# Patient Record
Sex: Female | Born: 1970 | Race: Black or African American | Hispanic: No | Smoking: Never smoker
Health system: Southern US, Community
[De-identification: ages and names within clinical notes are randomized; demographics above are authoritative.]

## PROBLEM LIST (undated history)

## (undated) DIAGNOSIS — H269 Unspecified cataract: Secondary | ICD-10-CM

## (undated) DIAGNOSIS — J45909 Unspecified asthma, uncomplicated: Secondary | ICD-10-CM

## (undated) DIAGNOSIS — D573 Sickle-cell trait: Secondary | ICD-10-CM

## (undated) DIAGNOSIS — L309 Dermatitis, unspecified: Secondary | ICD-10-CM

## (undated) HISTORY — DX: Unspecified cataract: H26.9

## (undated) HISTORY — DX: Dermatitis, unspecified: L30.9

## (undated) HISTORY — DX: Unspecified asthma, uncomplicated: J45.909

## (undated) HISTORY — PX: CATARACT EXTRACTION: SUR2

## (undated) HISTORY — DX: Sickle-cell trait: D57.3

---

## 2003-02-20 ENCOUNTER — Encounter (INDEPENDENT_AMBULATORY_CARE_PROVIDER_SITE_OTHER): Payer: Self-pay | Admitting: Specialist

## 2003-02-20 ENCOUNTER — Encounter: Payer: Self-pay | Admitting: Obstetrics and Gynecology

## 2003-02-20 ENCOUNTER — Ambulatory Visit (HOSPITAL_COMMUNITY): Admission: AD | Admit: 2003-02-20 | Discharge: 2003-02-20 | Payer: Self-pay | Admitting: Obstetrics and Gynecology

## 2004-01-16 ENCOUNTER — Ambulatory Visit (HOSPITAL_COMMUNITY): Admission: RE | Admit: 2004-01-16 | Discharge: 2004-01-16 | Payer: Self-pay | Admitting: Obstetrics and Gynecology

## 2004-02-12 ENCOUNTER — Inpatient Hospital Stay (HOSPITAL_COMMUNITY): Admission: AD | Admit: 2004-02-12 | Discharge: 2004-02-13 | Payer: Self-pay | Admitting: *Deleted

## 2004-04-02 ENCOUNTER — Ambulatory Visit (HOSPITAL_COMMUNITY): Admission: RE | Admit: 2004-04-02 | Discharge: 2004-04-02 | Payer: Self-pay | Admitting: Obstetrics and Gynecology

## 2009-04-01 ENCOUNTER — Encounter (INDEPENDENT_AMBULATORY_CARE_PROVIDER_SITE_OTHER): Payer: Self-pay | Admitting: Oral Surgery

## 2009-04-01 ENCOUNTER — Ambulatory Visit (HOSPITAL_COMMUNITY): Admission: RE | Admit: 2009-04-01 | Discharge: 2009-04-01 | Payer: Self-pay | Admitting: Oral Surgery

## 2010-10-30 LAB — CBC
HCT: 35.6 % — ABNORMAL LOW (ref 36.0–46.0)
Hemoglobin: 11.7 g/dL — ABNORMAL LOW (ref 12.0–15.0)
MCHC: 32.9 g/dL (ref 30.0–36.0)
RBC: 4.33 MIL/uL (ref 3.87–5.11)
RDW: 16.5 % — ABNORMAL HIGH (ref 11.5–15.5)

## 2010-10-30 LAB — BASIC METABOLIC PANEL
CO2: 24 mEq/L (ref 19–32)
Calcium: 8.9 mg/dL (ref 8.4–10.5)
Creatinine, Ser: 0.64 mg/dL (ref 0.4–1.2)
Glucose, Bld: 90 mg/dL (ref 70–99)
Sodium: 138 mEq/L (ref 135–145)

## 2010-12-11 NOTE — H&P (Signed)
NAMEBRYLA, BUREK                          ACCOUNT NO.:  [id number]   MEDICAL RECORD NO.:  [id number]                   PATIENT TYPE:  OUT   LOCATION:  ULT                                  [facility]:  [facility]   PHYSICIAN:  Crist Fat. Rivard, M.D.              DATE OF BIRTH:  04-01-1971   DATE OF ADMISSION:  01/16/2004  DATE OF DISCHARGE:  01/16/2004                                HISTORY & PHYSICAL   HISTORY OF PRESENT ILLNESS:  [name] is a 40 year old, gravida 5, para 3-  0-1-3, who presents at 61 and 1/7ths weeks for induction of labor secondary  to ultrasound showing the baby at the 75th to 90th percentile, and cervix 2-  3 cm, with irregular contractions.  She does report positive fetal movement.  No bleeding.  No rupture of membranes.  Her pregnancy has been followed by  the C.N.M. service at [facility], and is remarkable for (1) severe eczema, (2)  second trimester loss, (3) asthma, and (4) sickle cell trait.  The patient  was initially evaluated at the office of [facility] on July 29, 2003 at  approximately [redacted] weeks gestation.  EDC determined by dates, confirmed with  18-week ultrasound.  Her pregnancy has been essentially unremarkable, except  for long-standing history of eczema.  The patient did experience some  allergies, which affected her asthma.  She took Zyrtec with relief.  She has  had several ultrasounds this pregnancy, at 72, 27, 31, and 38 weeks.  The  most recent ultrasound on January 16, 2004 found an intrauterine single  pregnancy at 38 weeks at the 75th to 90th percentile.  Fluid was normal at  that time, with an AFI of 9.8 cm.  Placenta fundal posterior, grade II, no  previa.  At that time, the baby was in a cephalic presentation.  The patient  has reported good fetal movement since that time with irregular  contractions, no bleeding, no rupture of membranes.  She denies any PIH  symptoms.  No headache, visual changes, or epigastric pain.   PRENATAL LABORATORY WORK:  The  prenatal lab work on August 02, 2003 revealed  hemoglobin and hematocrit of 12.6 and 35.7, platelets 237,000.  Blood type  and Rh A positive, antibody screen negative, VDRL nonreactive, rubella  immune.  Hepatitis B surface antigen negative.  HIV declined.  GC and  Chlamydia negative.  Quad screen declined.  A 36 weeks, culture of the  vaginal tract is negative for group B strep, GC, and Chlamydia.   OB HISTORY:  In 1993, the patient had a normal spontaneous vaginal delivery  at 40 plus weeks in [place], [place], with the birth of a 7 plus  pound female infant with no complications.  Her name is Tiffany Ramos.  In 1996,  the patient had a normal spontaneous vaginal delivery with the birth of an 8  plus pound female infant named Tiffany Ramos.  In  1999, the patient had a NSVD in  [place] again with the birth of a 6 pound, 7 ounce female infant names  Tiffany Ramos.   MEDICAL HISTORY:  The patient has a history of an abnormal Pap smear, and  possibly LEEP procedure.  The patient has severe eczema over entire body  surface.  She utilizes several creams, which do help some.  On August 13, 2003, she was evaluated at [facility] and found to have atopic dermatitis.  She was to use erythromycin.  She did seek care in [place], as this was  more convenient, in February, and was treated with Keflex and triamcinolone  cream 1% . The itching has gotten somewhat better; however, the eczema  remains covering her body.  The patient with a history of asthma, and sickle  cell trait.   SURGICAL HISTORY:  D and E following second trimester loss.   FAMILY HISTORY:  Maternal aunt and maternal uncle with a history of heart  disease.  Maternal uncles x2 and patient's mother with history of  hypertension.  The patient's mother with a history of diabetes; also  maternal grandmother and paternal grandmother.  The patient's maternal aunt  has a history of seizures.   Otherwise, the patient has been hospitalized for  childbirth.   GENETIC HISTORY:  The patient has a history of sickle cell trait, and the  patient's mother also with sickle cell trait.   ALLERGIES:  No known drug allergies.   SOCIAL HISTORY:  She denies the use of tobacco, alcohol, or illicit drugs.   REVIEW OF SYSTEMS:  There are no signs or symptoms suggestive of focal or  systemic disease, and the patient is typical of one with a uterine pregnancy  at term at 41 weeks, with irregular contractions, for induction of labor.   PHYSICAL EXAMINATION:  VITAL SIGNS:  Stable, afebrile.  HEENT:  Unremarkable.  HEART:  Regular rate and rhythm.  LUNGS:  Clear.  ABDOMEN:  Gravid in its contour.  Uterine fundus is noted to extend 40 cm  above the level of the pubic symphysis.  Leopold's maneuver finds the infant  to be in a longitudinal lie, cephalic presentation.  Estimated fetal weight  is 8.5 pounds.  NST with a baseline of 140, with average long-term  variability reactivity is present with no periodic changes.  The patient is  contracting mildly and irregularly.  PELVIC:  Her cervix when checked in MAU on Sunday was a tight 2 cm dilated,  60% to 70% effaced, vertex -2.  EXTREMITIES:  No pathologic edema.  DTR's are 1+ with no clonus.   ASSESSMENT:  Intrauterine pregnancy at 41 weeks for induction of labor.   PLAN:  Admit per Dr. Dois Davenport Rivard.  Routine CSF orders.   PLAN:  Artificial rupture of membranes to stimulate labor, and Pitocin if  labor does not follow.  The patient is in agreement with this plan.     Rica Koyanagi, C.N.M.               Crist Fat Rivard, M.D.    SDM/MEDQ  D:  02/11/2004  T:  02/11/2004  Job:  [id number]

## 2010-12-11 NOTE — Op Note (Signed)
   NAMEMARSHAL, SCHRECENGOST                          ACCOUNT NO.:  [id number]   MEDICAL RECORD NO.:  [id number]                   PATIENT TYPE:  AMB   LOCATION:  [facility]                                 [facility]ILITY:  [facility]   PHYSICIAN:  Phil D. Okey Dupre, M.D.                  DATE OF BIRTH:  09-23-1970   DATE OF PROCEDURE:  02/20/2003  DATE OF DISCHARGE:                                 OPERATIVE REPORT   PROCEDURE:  Dilatation and evacuation.   PREOPERATIVE DIAGNOSIS:  Missed abortion.   POSTOPERATIVE DIAGNOSIS:  Missed abortion.   SURGEON:  Javier Glazier. Rose, M.D.   ESTIMATED BLOOD LOSS:  50 mL.   ANESTHESIA:  MAC plus local.   OPERATIVE FINDINGS:  Uterus about ten weeks gestational size; however, only  sounded to a depth of 7 cm; therefore, probably leiomyomata.  This was very  well confirmed by the examination after the procedure, which revealed the  uterus as still feeling like it was eight weeks gestational size.  Old type  of products of conception appeared to be present, because of the dark  burgundy to brown color of the evacuated products.   DESCRIPTION OF PROCEDURE:  Under satisfactory MAC sedation, with the patient  in the dorsal lithotomy position, the perineum was prepped and draped in the  usual sterile manner.  Bimanual pelvic examination revealed a uterus which  was about ten weeks gestational size, regular configuration with normal  adnexa.   A weighted speculum was placed in the posterior fourchette of the vagina and  a single toothed tenaculum placed.  BUS was within normal length.  The  vagina was cleaned and well irrigated.  The paracervix was grasped with a  single-toothed tenaculum, and 10 mL of 1% Xylocaine was injected in the  paracervical areas at 4 and 8 o'clock (10 mL in each area).  The uterine  cavity was then sounded to a depth of 7 cm anteriorly, and the cervical os  dilated with ease to a #10 Hagar dilator.   A #10 curved suction curet was used to evacuate the  uterine contents without  incident as described above.   ESTIMATED BLOOD LOSS:  Blood loss during the procedure approximately 500 mL.   The tenaculum and speculum were removed from the vagina.  The cervix was  observed for bleeding; none was noted.  Products of conception were sent for  pathological diagnoses.   The patient was transferred to the recovery room in satisfactory condition.                                              Phil D. Okey Dupre, M.D.   PDR/MEDQ  D:  02/20/2003  T:  02/20/2003  Job:  [id number]

## 2010-12-11 NOTE — Op Note (Signed)
NAMEARLINDA, Tiffany Ramos                          ACCOUNT NO.:  [id number]   MEDICAL RECORD NO.:  [id number]                   PATIENT TYPE:  AMB   LOCATION:  [facility]                                  [facility]   PHYSICIAN:  Osborn Coho, M.D.                DATE OF BIRTH:  Mar 28, 1971   DATE OF PROCEDURE:  04/02/2004  DATE OF DISCHARGE:                                 OPERATIVE REPORT   PREOPERATIVE DIAGNOSES:  1.  Desires permanent sterilization.  2.  Status post normal spontaneous vaginal delivery, February 12, 2004.   POSTOPERATIVE DIAGNOSIS:  1.  Desires permanent sterilization.  2.  Status post normal spontaneous vaginal delivery, February 12, 2004.   OPERATION PERFORMED:  Laparoscopic tubal sterilization.   SURGEON:  Osborn Coho, M.D.   ANESTHESIA:  General.   FLUIDS REPLACED:  1200 mL.   URINE OUTPUT:  Staight cath prior to procedure approximately 200 mL.   ESTIMATED BLOOD LOSS:  Minimal.   COMPLICATIONS:  None.   FINDINGS:  Normal-appearing bilateral ovaries and fallopian tubes.   DESCRIPTION OF PROCEDURE:  The patient was taken to the operating room after  the risks, benefits and alternatives were discussed with the patient.  The  patient verbalized understanding and consent signed and witnessed.  The  patient was placed under general anesthesia, prepped and draped in the  normal sterile fashion.  The patient was prepped in dorsal lithotomy  position and a bivalve speculum placed in the patient's vagina and a Hulka  tenaculum placed for uterine manipulation.  The speculum was removed.  Attention was turned to the umbilicus where a 10 mm incision was made and a  Veress needle passed into the intra-abdominal cavity.  Pneumoperitoeum  achieved.  A 10 mm trocar was then advanced to the intra-abdominal cavity  and the laparoscope introduced with the findings as noted above.  The right  fallopian tube was clamped in the isthmic portion for four contiguous  segments and  coagulated with the bipolar Kleppingers.  The same was done on  the left fallopian tube.  An approximately 2 cm  stump remained from the uterine cornua.  Pneumoperitoneum was relieved and  instruments removed under direct visualization.  The fascia was repaired  with 0 Vicryl in a running fashion.  The skin was closed with 3-0 Monocryl  using a subcuticular stitch.  The patient tolerated the procedure well and  is currently being extubated per anesthesia.                                               Osborn Coho, M.D.    AR/MEDQ  D:  04/02/2004  T:  04/02/2004  Job:  [id number]

## 2014-07-11 DIAGNOSIS — L209 Atopic dermatitis, unspecified: Secondary | ICD-10-CM | POA: Insufficient documentation

## 2015-07-08 ENCOUNTER — Encounter: Payer: Self-pay | Admitting: Allergy and Immunology

## 2015-07-08 ENCOUNTER — Ambulatory Visit (INDEPENDENT_AMBULATORY_CARE_PROVIDER_SITE_OTHER): Payer: Medicaid Other | Admitting: Allergy and Immunology

## 2015-07-08 VITALS — BP 112/72 | HR 80 | Temp 98.2°F | Resp 20 | Ht 66.14 in | Wt 181.9 lb

## 2015-07-08 DIAGNOSIS — J309 Allergic rhinitis, unspecified: Secondary | ICD-10-CM

## 2015-07-08 DIAGNOSIS — L209 Atopic dermatitis, unspecified: Secondary | ICD-10-CM | POA: Diagnosis not present

## 2015-07-08 DIAGNOSIS — H1010 Acute atopic conjunctivitis, unspecified eye: Secondary | ICD-10-CM | POA: Diagnosis not present

## 2015-07-08 DIAGNOSIS — J4530 Mild persistent asthma, uncomplicated: Secondary | ICD-10-CM

## 2015-07-08 MED ORDER — CETIRIZINE HCL 10 MG PO TABS: 10.0000 mg | ORAL_TABLET | Freq: Every day | ORAL | Status: DC

## 2015-07-08 MED ORDER — ALBUTEROL SULFATE HFA 108 (90 BASE) MCG/ACT IN AERS: 2.0000 | INHALATION_SPRAY | RESPIRATORY_TRACT | Status: DC | PRN

## 2015-07-08 MED ORDER — BECLOMETHASONE DIPROPIONATE 40 MCG/ACT IN AERS: 2.0000 | INHALATION_SPRAY | Freq: Every day | RESPIRATORY_TRACT | Status: DC

## 2015-07-08 MED ORDER — FLUTICASONE PROPIONATE 50 MCG/ACT NA SUSP: 2.0000 | Freq: Every day | NASAL | Status: DC

## 2015-07-08 NOTE — Progress Notes (Addendum)
[facility]    NEW PATIENT NOTE  Referring Provider: Jackie Plum, MD Primary Provider: Jackie Plum, MD    Subjective:   Chief Complaint:  Tiffany Ramos is a 44 y.o. female with a chief complaint of Eczema and Asthma  who presents to the clinic with the following problems:  HPI Comments:  Tiffany Ramos returns to this clinic on 07/08/2015 in reevaluation of her asthma, allergic rhinoconjunctivitis, and atopic dermatitis. She's not been seen in this clinic for 3-1/2 years. Initially she was seen by Dr. Willa Rough who documented significant aero allergen hypersensitivity and provided her with allergen avoidance measures. According to [facility] she feels like her asthma is under good control as long she continues to use Qvar 40 2 inhalations one time per day. While doing so she has not had any exacerbations of her asthma requiring her to get a systemic steroids nor does she use a short acting bronchodilator very often. Revoking factors for her asthma do include going outdoors during the spring and getting exposed to lots of pollen. She also feels as though consistent use of Flonase has resulted in very good control of her upper airway symptoms. Once again provoking factors include exposure to the outdoors during the spring. She has very severe atopic dermatitis is and is now under the care of [facility] department and apparently she was being treated with methotrexate as well as topical anti-inflammatory agents. She is very satisfied with the response she is receiving from the therapy that she is receiving at [facility]. Tiffany Ramos also informs me that she does not eat pecan based upon skin test reactivity to this not. However, it should be noted that she has never had a reaction to pecan in the past and she can eat walnut without any difficulty and when I reviewed the skin test that she had  performed in the past the skin test only identified hypersensitivity against pecan pollen and not pecan food product.   Past Medical History  Diagnosis Date  . Asthma   . Eczema   . Sickle cell trait (HCC)     History reviewed. No pertinent past surgical history.  Outpatient Encounter Prescriptions as of 07/08/2015  Medication Sig  . albuterol (PROAIR HFA) 108 (90 BASE) MCG/ACT inhaler Inhale 2 puffs into the lungs every 4 (four) hours as needed.  . beclomethasone (QVAR) 40 MCG/ACT inhaler Inhale 2 puffs into the lungs daily.  Marland Kitchen desonide (DESOWEN) 0.05 % ointment daily.  . folic acid (FOLVITE) 1 MG tablet Take 1 mg by mouth daily.  Marland Kitchen triamcinolone ointment (KENALOG) 0.1 % 2 (two) times daily.  . [DISCONTINUED] albuterol (PROAIR HFA) 108 (90 BASE) MCG/ACT inhaler Inhale 2 puffs into the lungs every 4 (four) hours as needed.  . [DISCONTINUED] beclomethasone (QVAR) 40 MCG/ACT inhaler Inhale 2 puffs into the lungs daily.  . cetirizine (ZYRTEC) 10 MG tablet Take 1 tablet (10 mg total) by mouth daily.  . Cholecalciferol (D 1000) 1000 UNITS capsule Take 1,000 Units by mouth daily.  . fluticasone (FLONASE) 50 MCG/ACT nasal spray Place 2 sprays into both nostrils daily.  . Olopatadine HCl (PATADAY) 0.2 % SOLN Apply to eye 2 (two) times daily.   No facility-administered encounter medications on file as of 07/08/2015.    Meds ordered this encounter  Medications  . albuterol (PROAIR HFA) 108 (90 BASE) MCG/ACT inhaler    Sig: Inhale 2 puffs into the lungs every  4 (four) hours as needed.    Dispense:  18 g    Refill:  2  . beclomethasone (QVAR) 40 MCG/ACT inhaler    Sig: Inhale 2 puffs into the lungs daily.    Dispense:  1 Inhaler    Refill:  11  . fluticasone (FLONASE) 50 MCG/ACT nasal spray    Sig: Place 2 sprays into both nostrils daily.    Dispense:  16 g    Refill:  11  . cetirizine (ZYRTEC) 10 MG tablet    Sig: Take 1 tablet (10 mg total) by mouth daily.    Dispense:  30 tablet     Refill:  11    No Known Allergies  Review of Systems  Constitutional: Negative for fever, chills and fatigue.  HENT: Negative for congestion, ear pain, facial swelling, hearing loss, nosebleeds, postnasal drip, rhinorrhea, sinus pressure, sneezing, sore throat, tinnitus, trouble swallowing and voice change.   Eyes: Negative for pain, discharge, redness and itching.  Respiratory: Negative for cough, chest tightness, shortness of breath and wheezing.   Cardiovascular: Negative for chest pain and leg swelling.  Gastrointestinal: Negative for nausea, vomiting and abdominal pain.  Endocrine: Negative for cold intolerance and heat intolerance.  Musculoskeletal: Negative for myalgias and arthralgias.  Skin: Positive for rash.  Allergic/Immunologic: Negative.   Neurological: Negative for dizziness and headaches.  Hematological: Negative for adenopathy.    Family History  Problem Relation Age of Onset  . Hypertension Mother   . Diabetes Mother   . Eczema Father   . Eczema Sister   . Eczema Paternal Aunt     Social History   Social History  . Marital Status: Legally Separated    Spouse Name: N/A  . Number of Children: N/A  . Years of Education: N/A   Occupational History  . Not on file.   Social History Main Topics  . Smoking status: Never Smoker   . Smokeless tobacco: Never Used  . Alcohol Use: Not on file  . Drug Use: Not on file  . Sexual Activity: Not on file   Other Topics Concern  . Not on file   Social History Narrative  . No narrative on file    Environmental and Social history  Lives in a house with a dry environment, a dog located outside the household, carpeting in the bedroom, sleeping on a step mattress with plastic on the bed and pillow, and no smokers located inside the household. She is a stay-at-home mom.   Objective:   Filed Vitals:   07/08/15 1416  BP: 112/72  Pulse: 80  Temp: 98.2 F (36.8 C)  Resp: 20   Height: 5' 6.14" (168  cm) Weight: 181 lb 14.1 oz (82.5 kg)  Physical Exam  Constitutional: She appears well-developed and well-nourished. No distress.  HENT:  Head: Normocephalic and atraumatic. Head is without right periorbital erythema and without left periorbital erythema.  Right Ear: Tympanic membrane, external ear and ear canal normal. No drainage or tenderness. No foreign bodies. Tympanic membrane is not injected, not scarred, not perforated, not erythematous, not retracted and not bulging. No middle ear effusion.  Left Ear: Tympanic membrane, external ear and ear canal normal. No drainage or tenderness. No foreign bodies. Tympanic membrane is not injected, not scarred, not perforated, not erythematous, not retracted and not bulging.  No middle ear effusion.  Nose: Mucosal edema present. No rhinorrhea, nose lacerations or sinus tenderness.  No foreign bodies.  Mouth/Throat: Oropharynx is clear and moist. Abnormal dentition (  Edentulous). No oropharyngeal exudate, posterior oropharyngeal edema, posterior oropharyngeal erythema or tonsillar abscesses.  Eyes: Lids are normal. Right eye exhibits no chemosis, no discharge and no exudate. No foreign body present in the right eye. Left eye exhibits no chemosis, no discharge and no exudate. No foreign body present in the left eye. Right conjunctiva is not injected. Left conjunctiva is not injected.  Neck: Neck supple. No tracheal tenderness present. No tracheal deviation and no edema present. No thyroid mass and no thyromegaly present.  Cardiovascular: Normal rate, regular rhythm, S1 normal and S2 normal.  Exam reveals no gallop.   No murmur heard. Pulmonary/Chest: No accessory muscle usage or stridor. No respiratory distress. She has no wheezes. She has no rhonchi. She has no rales.  Abdominal: Soft. She exhibits no distension and no mass. There is no tenderness. There is no rebound and no guarding.  Musculoskeletal: She exhibits no edema or tenderness.  Lymphadenopathy:        Head (right side): No tonsillar adenopathy present.       Head (left side): No tonsillar adenopathy present.    She has no cervical adenopathy.  Neurological: She is alert.  Skin: Rash noted. She is not diaphoretic.  Widespread hyperpigmented lichenified plaques across greater than 90% of her body with slight erythema and significant    Psychiatric: She has a normal mood and affect. Her behavior is normal.    Diagnostics:  Allergy skin tests were not performed.   Spirometry was performed and demonstrated an FEV1 of 1.92 @ 77 % of predicted.  The patient had an Asthma Control Test with the following results: ACT Total Score: 24.     Assessment and Plan:    1. Mild persistent asthma, uncomplicated   2. Allergic rhinoconjunctivitis   3. Atopic dermatitis      1. Qvar 40 2 puffs one time per day. Increase to 3 inhalations 3 times per day during "asthma flareup"  2. Continue Flonase one-2 sprays each nostril one time per day  3. Continue ProAir HFA 2 puffs every 4-6 hours if needed  4. Use cetirizine 10 mg one tablet one time per day if needed  5. Annual fall flu vaccine every year  6. Continue therapy prescribed by dermatologist  7. Return to clinic in 1 year or earlier if problem  Shalandra's atopic respiratory disease appears to be under relatively good control this point in time will consistently using Qvar and Flonase and performing some allergen avoidance measures. She is not very interested in pursuing any additional evaluation or treatment for her atopic disease at this point in time and is very satisfied with the response she is receiving while utilizing therapy described above. I've renewed all of her medications and we will see her back in this clinic should there be a significant problem during the interval of one year. She would definitely be a candidate for immunotherapy in an attempt to alter her immunologic dysregulation. I am not going to address the issue of  her atopic dermatitis as this appears to be under the control of the Medical Center at this point. I do not think there is any need to have her avoid pecan as her skin test reactivity was directed against pollen and not a specific food and she does not have a history of having reactivity directed against pecan and she can eat walnuts, a close cross reactor with pecan, without any difficulty.    Laurette SchimkeEric Kozlow, MD [facility]

## 2015-07-08 NOTE — Patient Instructions (Addendum)
  1. Qvar 40 2 puffs one time per day. Increase to 3 inhalations 3 times per day during "asthma flareup"  2. Continue Flonase one-2 sprays each nostril one time per day  3. Continue ProAir HFA 2 puffs every 4-6 hours if needed  4. Use cetirizine 10 mg one tablet one time per day if needed  5. Pataday one drop each eye one time per day  6. Annual fall flu vaccine every year  7. Continue therapy prescribed by dermatologist  8. Return to clinic in 1 year or earlier if problem  9. Immunotherapy?

## 2016-07-07 ENCOUNTER — Encounter: Payer: Self-pay | Admitting: Allergy and Immunology

## 2016-07-07 ENCOUNTER — Encounter (INDEPENDENT_AMBULATORY_CARE_PROVIDER_SITE_OTHER): Payer: Self-pay

## 2016-07-07 ENCOUNTER — Ambulatory Visit (INDEPENDENT_AMBULATORY_CARE_PROVIDER_SITE_OTHER): Payer: Medicaid Other | Admitting: Allergy and Immunology

## 2016-07-07 VITALS — BP 126/80 | HR 100 | Resp 18

## 2016-07-07 DIAGNOSIS — L2089 Other atopic dermatitis: Secondary | ICD-10-CM

## 2016-07-07 DIAGNOSIS — J4541 Moderate persistent asthma with (acute) exacerbation: Secondary | ICD-10-CM

## 2016-07-07 DIAGNOSIS — J3089 Other allergic rhinitis: Secondary | ICD-10-CM

## 2016-07-07 MED ORDER — FLUTICASONE PROPIONATE 50 MCG/ACT NA SUSP: NASAL | 5 refills | Status: DC

## 2016-07-07 MED ORDER — BUDESONIDE-FORMOTEROL FUMARATE 160-4.5 MCG/ACT IN AERO: INHALATION_SPRAY | RESPIRATORY_TRACT | 5 refills | Status: DC

## 2016-07-07 NOTE — Patient Instructions (Addendum)
  1. Change Qvar to Symbicort 160 - Two inhalations two times per day  2. Continue Flonase one-2 sprays each nostril one time per day  3. Continue ProAir HFA 2 puffs every 4-6 hours if needed  4. Use cetirizine 10 mg one tablet one time per day if needed  5. Use Pataday one drop each eye one time per day if needed  6. Continue topical therapy prescribed by dermatologist  7. Prednisone 10mg  tablet - two tablet one time per day for 5 days.  8. Return to clinic in 2 weeks or earlier if problem  9. Consider Dupilumab adminstration

## 2016-07-07 NOTE — Progress Notes (Signed)
Follow-up Note  Referring Provider: Jackie Plumsei-Bonsu, George, MD Primary Provider: Jackie PlumSEI-BONSU,GEORGE, MD Date of Office Visit: 07/07/2016  Subjective:   Tiffany Ramos (DOB: June 06, 1971) is a 45 y.o. female [facility] in re-evaluation of the following:  HPI: Tiffany Ramos returns to this clinic in evaluation of her multiorgan atopic disease. She has not been seen in his clinic in 1 year.  She states that her asthma is "under good control". She has not required a systemic steroid to treat an exacerbation. However, during the winter she does develop problems with wheezing and coughing. She does not use a short acting bronchodilator very often averaging out to less than twice a week and does not use a bronchodilator at nighttime. She does appear to have some intermittent coughing and wheezing on a pretty regular basis though.  She believes that her atopic dermatitis is "under good control". She [place] from [place] in [place] multiple topical agents.  Her nose has not really been causing her much of a problem at this point. It does not sound as though she has required an antibiotic to treat an episode of sinusitis.  She did receive the flu vaccine this fall.    Medication List      albuterol 108 (90 Base) MCG/ACT inhaler Commonly known as:  PROAIR HFA Inhale 2 puffs into the lungs every 4 (four) hours as needed.   budesonide-formoterol 160-4.5 MCG/ACT inhaler Commonly known as:  SYMBICORT Inhale two puffs twice daily as directed. Rinse, gargle, and spit after use.   cetirizine 10 MG tablet Commonly known as:  ZYRTEC Take 1 tablet (10 mg total) by mouth daily.   D 1000 1000 units capsule Generic drug:  Cholecalciferol Take 1,000 Units by mouth daily.   desonide 0.05 % ointment Commonly known as:  DESOWEN daily.   fluticasone 50 MCG/ACT nasal spray Commonly known as:  FLONASE Use two sprays in each  nostril once daily as directed   PATADAY 0.2 % Soln Generic drug:  Olopatadine HCl Apply to eye 2 (two) times daily.   triamcinolone ointment 0.1 % Commonly known as:  KENALOG 2 (two) times daily.       Past Medical History:  Diagnosis Date  . Asthma   . Eczema   . Sickle cell trait (HCC)     History reviewed. No pertinent surgical history.  No Known Allergies  Review of systems negative except as noted in HPI / PMHx or noted below:  Review of Systems  Constitutional: Negative.   HENT: Negative.   Eyes: Negative.   Respiratory: Negative.   Cardiovascular: Negative.   Gastrointestinal: Negative.   Genitourinary: Negative.   Musculoskeletal: Negative.   Skin: Negative.   Neurological: Negative.   Endo/Heme/Allergies: Negative.   Psychiatric/Behavioral: Negative.      Objective:   Vitals:   07/07/16 0933  BP: 126/80  Pulse: 100  Resp: 18          Physical Exam  Constitutional: She is well-developed, well-nourished, and in no distress.  HENT:  Head: Normocephalic.  Right Ear: Tympanic membrane, external ear and ear canal normal.  Left Ear: Tympanic membrane, external ear and ear canal normal.  Nose: Mucosal edema present. No rhinorrhea.  Mouth/Throat: Uvula is midline, oropharynx is clear and moist and mucous membranes are normal. No oropharyngeal exudate.  Eyes: Conjunctivae are normal.  Neck: Trachea normal. No tracheal tenderness present. No tracheal deviation present. No thyromegaly present.  Cardiovascular: Normal rate, regular rhythm, S1 normal, S2 normal and normal heart sounds.   No murmur heard. Pulmonary/Chest: No stridor. No respiratory distress. She has wheezes (bilateral expiratory wheezes). She has no rales.  Musculoskeletal: She exhibits no edema.  Lymphadenopathy:       Head (right side): No tonsillar adenopathy present.       Head (left side): No tonsillar adenopathy present.    She has no cervical adenopathy.  Neurological: She is  alert. Gait normal.  Skin: Rash (Widespread plaques of lichenified scaly erythematous skin across body especially extremities.) noted. She is not diaphoretic. No erythema. Nails show no clubbing.  Psychiatric: Mood and affect normal.    Diagnostics:    Spirometry was performed and demonstrated an FEV1 of 1.35 at 54 % of predicted.  Assessment and Plan:   1. Asthma, not well controlled, moderate persistent, with acute exacerbation   2. Other allergic rhinitis   3. Other atopic dermatitis     1. Change Qvar to Symbicort 160 - Two inhalations two times per day  2. Continue Flonase one-2 sprays each nostril one time per day  3. Continue ProAir HFA 2 puffs every 4-6 hours if needed  4. Use cetirizine 10 mg one tablet one time per day if needed  5. Use Pataday one drop each eye one time per day if needed  6. Continue topical therapy prescribed by dermatologist  7. Prednisone 10mg  tablet - two tablet one time per day for 5 days.  8. Return to clinic in 2 weeks or earlier if problem  9. Consider Dupilumab adminstration  Tiffany Ramos appears to have atopic disease that is out of control. She minimizes her symptoms and has done so for many years. I'm going to change her inhaled steroid with the introduction of a long-acting bronchodilator and give her systemic steroid today and I've given her literature on dupilumab administration. I think dupilumab would be her best bet as it would probably treat not just her skin condition but also her other atopic disease. She has a strong needle phobia and she also has a lots of hesitation about changing any of her therapy for her atopic disease as she has acclimated to living with active atopic disease for many many years. I will see her back in this clinic in 2 weeks to make sure were going down the road to improvement.I will provide her an action plan for asthma flareups at that point in time.  Laurette SchimkeEric Sandeep Delagarza, MD [facility]

## 2016-07-15 ENCOUNTER — Other Ambulatory Visit: Payer: Self-pay | Admitting: Allergy and Immunology

## 2017-12-21 ENCOUNTER — Other Ambulatory Visit: Payer: Self-pay | Admitting: Allergy and Immunology

## 2017-12-26 ENCOUNTER — Ambulatory Visit: Payer: Medicaid Other | Admitting: Allergy & Immunology

## 2017-12-26 ENCOUNTER — Encounter: Payer: Self-pay | Admitting: Allergy & Immunology

## 2017-12-26 VITALS — BP 120/76 | HR 88 | Resp 20 | Ht 65.5 in | Wt 199.2 lb

## 2017-12-26 DIAGNOSIS — L2089 Other atopic dermatitis: Secondary | ICD-10-CM

## 2017-12-26 DIAGNOSIS — J4540 Moderate persistent asthma, uncomplicated: Secondary | ICD-10-CM

## 2017-12-26 DIAGNOSIS — J3089 Other allergic rhinitis: Secondary | ICD-10-CM | POA: Diagnosis not present

## 2017-12-26 MED ORDER — BUDESONIDE-FORMOTEROL FUMARATE 160-4.5 MCG/ACT IN AERO: INHALATION_SPRAY | RESPIRATORY_TRACT | 0 refills | Status: DC

## 2017-12-26 MED ORDER — FLUTICASONE PROPIONATE 50 MCG/ACT NA SUSP: NASAL | 0 refills | Status: DC

## 2017-12-26 MED ORDER — ALBUTEROL SULFATE HFA 108 (90 BASE) MCG/ACT IN AERS: 2.0000 | INHALATION_SPRAY | RESPIRATORY_TRACT | 0 refills | Status: DC | PRN

## 2017-12-26 NOTE — Progress Notes (Signed)
FOLLOW UP  Date of Service/Encounter:  12/26/17   Assessment:   Moderate persistent asthma, uncomplicated  History of non-compliance  Atopic dermatitis - not well controlled  Allergic rhinitis   Plan/Recommendations:   1. Moderate persistent asthma, uncomplicated - Lung testing looked fairly terrible today.  - It did improve with the use of the nebulizer today, but it take a couple of them to do so.  - I would strongly consider starting Dupixent (paperwork provided). - Spacer sample and demonstration provided. - Daily controller medication(s): Symbicort 160/4.17mcg two puffs twice daily with spacer - Prior to physical activity: ProAir 2 puffs 10-15 minutes before physical activity. - Rescue medications: ProAir 4 puffs every 4-6 hours as needed - Asthma control goals:  * Full participation in all desired activities (may need albuterol before activity) * Albuterol use two time or less a week on average (not counting use with activity) * Cough interfering with sleep two time or less a month * Oral steroids no more than once a year * No hospitalizations  2. Atopic dermatitis - Continue with the topical medications, per your Dermatologist. - Strongly consider starting Dupixent (injections every two weeks).  3. Allergic rhinitis - Continue with fluticasone two sprays per nostril daily. - Continue with the cetirizine 10mg  daily.  4. Return in about 1 month (around 01/25/2018).   Subjective:   Tiffany Ramos is a 47 y.o. female presenting today for follow up of  Chief Complaint  Patient presents with  . Asthma  . Wheezing    Tiffany Ramos has a history of the following: There are no active problems to display for this patient.   History obtained from: chart review and patient.  Tiffany Ramos's Primary Care Provider is Jackie Plum, MD.     Tiffany Ramos is a 47 y.o. female presenting for a follow up visit. She was last seen in December 2017 by Dr. Lucie Leather.  At that time, her asthma was not under good control, therefore she was switched from Qvar to Symbicort. Review of her chart shows that she tends to only show up every 18 months or so. Prior to seeing Dr. Lucie Leather, she was last seen by Dr. Willa Rough, although even then she was seen only every 18-36 months.   Since the last visit, she tells [place] that she has done well. But she has been out of Symbicort since the end of this past week, which is when she started to have more problems with regards to her breathing. Since Friday, her symptoms have worsened over time. She has been treating her symptoms with Benadryl as well as albuterol which does provide some transient relief. She is having congestion and postnasal drip. ACT is 14 today indicating poor asthma control.   Eczema has been out of control with the heat. She does see a dermatologist and she is unable to tell me what topical medications she is using at this time. She denies that she has ever needed an antibiotic to treat a Staphylococcal skin infection.   Otherwise, there have been no changes to her past medical history, surgical history, family history, or social history.    Review of Systems: a 14-point review of systems is pertinent for what is mentioned in HPI.  Otherwise, all other systems were negative. Constitutional: negative other than that listed in the HPI Eyes: negative other than that listed in the HPI Ears, nose, mouth, throat, and face: negative other than that listed in the HPI Respiratory: negative other than  that listed in the HPI Cardiovascular: negative other than that listed in the HPI Gastrointestinal: negative other than that listed in the HPI Genitourinary: negative other than that listed in the HPI Integument: negative other than that listed in the HPI Hematologic: negative other than that listed in the HPI Musculoskeletal: negative other than that listed in the HPI Neurological: negative other than that listed in the  HPI Allergy/Immunologic: negative other than that listed in the HPI    Objective:   Blood pressure 120/76, pulse 88, resp. rate 20, height 5' 5.5" (1.664 m), weight 199 lb 3.2 oz (90.4 kg), SpO2 95 %. Body mass index is 32.64 kg/m.   Physical Exam:  General: Alert, interactive, in mild distress.  Eyes: No conjunctival injection bilaterally, no discharge on the right, no discharge on the left and no Horner-Trantas dots present. PERRL bilaterally. EOMI without pain. No photophobia.  Ears: Right OME, Left OME, Right TM intact without perforation and Left TM intact without perforation.  Nose/Throat: External nose within normal limits and septum midline. Turbinates edematous and pale with clear discharge. Posterior oropharynx erythematous without cobblestoning in the posterior oropharynx. Tonsils 2+ without exudates.  Tongue without thrush. Lungs: Decreased breath sounds with expiratory wheezing bilaterally. Increased work of breathing. CV: Normal S1/S2. No murmurs. Capillary refill <2 seconds.  Skin: Dry, hyperpigmented, thickened patches on the bilateral arms, elbows, wrists, and neck. There are excorations over her entire body. Neuro:   Grossly intact. No focal deficits appreciated. Responsive to questions.  Diagnostic studies:   Spirometry: results abnormal (FEV1: 0.74/28%, FVC: 1.00/31%, FEV1/FVC: 74%).    Spirometry consistent with possible restrictive disease. Xopenex/Atrovent nebulizer treatment given in clinic with significant improvement in FEV1 and FVC per ATS criteria.  Allergy Studies: none    Malachi BondsJoel Lennell Shanks, MD  [facility]

## 2017-12-26 NOTE — Patient Instructions (Addendum)
1. Moderate persistent asthma, uncomplicated - Lung testing looked fairly terrible today.  - It did improve with the use of the nebulizer today, but it take a couple of them to do so.  - I would strongly consider starting Dupixent (paperwork provided). - Spacer sample and demonstration provided. - Daily controller medication(s): Symbicort 160/4.535mcg two puffs twice daily with spacer - Prior to physical activity: ProAir 2 puffs 10-15 minutes before physical activity. - Rescue medications: ProAir 4 puffs every 4-6 hours as needed - Asthma control goals:  * Full participation in all desired activities (may need albuterol before activity) * Albuterol use two time or less a week on average (not counting use with activity) * Cough interfering with sleep two time or less a month * Oral steroids no more than once a year * No hospitalizations  2. Atopic dermatitis - Continue with the topical medications, per your Dermatologist. - Strongly consider starting Dupixent (injections every two weeks).  3. Allergic rhinitis - Continue with fluticasone two sprays per nostril daily. - Continue with the cetirizine 10mg  daily.  4. Return in about 1 month (around 01/25/2018).   Please inform us of any Emergency Department visits, hospitalizations, or changes in symptoms. Call us before going to the ED for breathing or allergy symptoms since we might be able to fit you in for a sick visit. Feel free to contact us anytime with any questions, problems, or concerns.  It was a pleasure to meet you today!  Websites that have reliable patient information: 1. American Academy of Asthma, Allergy, and Immunology: www.aaaai.org 2. Food Allergy Research and Education (FARE): foodallergy.org 3. Mothers of Asthmatics: http://www.asthmacommunitynetwork.org 4. American College of Allergy, Asthma, and Immunology: MissingWeapons.cawww.acaai.org   Make sure you are registered to vote!

## 2018-01-20 ENCOUNTER — Telehealth: Payer: Self-pay | Admitting: *Deleted

## 2018-01-20 NOTE — Telephone Encounter (Signed)
That is frustrating. Thank you for the update!   Malachi BondsJoel Mardene Lessig, MD [facility]

## 2018-01-20 NOTE — Telephone Encounter (Signed)
Per Dr Dellis AnesGallagher I tried to contact patient to discuss Dupixent for her atopic dermatitis.  He made me aware that she is afraid of needles but I thought I would at least call her and have a discussion regarding the benefits.  I had L/M for patient on 6/6 and 6/10 with no response and on 01/13/18 I tried to call patient again on 6/21 and advised number not working.  Since I have not heard back from patient I will assume she is not interested in therapy and if she decides in the future she wants to start she can give me call

## 2018-08-15 ENCOUNTER — Other Ambulatory Visit: Payer: Self-pay | Admitting: Allergy & Immunology

## 2019-03-08 ENCOUNTER — Other Ambulatory Visit: Payer: Self-pay | Admitting: Allergy & Immunology

## 2019-03-15 ENCOUNTER — Ambulatory Visit: Payer: Medicaid Other | Admitting: Allergy & Immunology

## 2019-03-15 ENCOUNTER — Encounter: Payer: Self-pay | Admitting: Allergy & Immunology

## 2019-03-15 ENCOUNTER — Other Ambulatory Visit: Payer: Self-pay

## 2019-03-15 VITALS — BP 122/74 | HR 76 | Temp 98.0°F | Resp 16 | Ht 64.5 in | Wt 195.0 lb

## 2019-03-15 DIAGNOSIS — J4540 Moderate persistent asthma, uncomplicated: Secondary | ICD-10-CM

## 2019-03-15 DIAGNOSIS — J3089 Other allergic rhinitis: Secondary | ICD-10-CM

## 2019-03-15 DIAGNOSIS — L2089 Other atopic dermatitis: Secondary | ICD-10-CM

## 2019-03-15 MED ORDER — ALBUTEROL SULFATE HFA 108 (90 BASE) MCG/ACT IN AERS: 2.0000 | INHALATION_SPRAY | RESPIRATORY_TRACT | 1 refills | Status: DC | PRN

## 2019-03-15 MED ORDER — BUDESONIDE-FORMOTEROL FUMARATE 160-4.5 MCG/ACT IN AERO: INHALATION_SPRAY | RESPIRATORY_TRACT | 2 refills | Status: AC

## 2019-03-15 NOTE — Progress Notes (Signed)
FOLLOW UP  Date of Service/Encounter:  03/15/19   Assessment:   Moderate persistent asthma, uncomplicated  History of non-compliance  Atopic dermatitis - not well controlled  Allergic rhinitis   Tiffany Ramos presents for a follow up visit one year after I last saw her. Her skin looks very rough and uncontrolled. I continue to think that Dupixent would be an excellent addition, but she is not very excited about doing injections. We are going to continue her with Symbicort two puffs twice daily. We are going to provide her with three refills and have her come back in three months for another evaluation.   Plan/Recommendations:   1. Moderate persistent asthma, uncomplicated - Lung testing looked fairly terrible today.  - Consider starting Dupixent to treat your breathing and your skin.  - It is important to keep follow up appointments so that we can manage your breathing better.  - Daily controller medication(s): Symbicort 160/4.565mcg two puffs twice daily with spacer - Prior to physical activity: ProAir 2 puffs 10-15 minutes before physical activity. - Rescue medications: ProAir 4 puffs every 4-6 hours as needed - Asthma control goals:  * Full participation in all desired activities (may need albuterol before activity) * Albuterol use two time or less a week on average (not counting use with activity) * Cough interfering with sleep two time or less a month * Oral steroids no more than once a year * No hospitalizations  2. Atopic dermatitis - Continue with the topical medications, per your Dermatologist.  3. Allergic rhinitis - Continue with fluticasone two sprays per nostril daily. - Continue with the levocetirizine 5mg  daily.  4. Return in about 3 months (around 06/15/2019). This can be an in-person, a virtual Webex or a telephone follow up visit.  Subjective:   Tiffany Ramos is a 48 y.o. female presenting today for follow up of  Chief Complaint  Patient presents  with  . Follow-up  . Medication Reaction    Tiffany Ramos has a history of the following: There are no active problems to display for this patient.   History obtained from: chart review and patient.  Tiffany Ramos is a 48 y.o. female presenting for a follow up visit. She was last seen in June 2019. At that time, her lung testing looked awful. It did improve with the nebulizer treatment. We discussed starting Dupixent. We continued her Symbicort two puffs twice daily.   Since the last visit, she tells me that she has done well. She has taken so long to get back to us because she has been using some old wive's fixes. She tells me that she is using lemon juice mixed with something else which she puts into her humidifier.   Asthma/Respiratory Symptom History: She does use her Symbicort but it is clear that she is not using it twice daily as recommended; otherwise she would have been in much sooner for refills. She has not needed any prednisone at all. She tells me that she has not been to the ED at all and she has been sleeping well at night. It is unclear how often she is using the rescue inhaler.   Allergic Rhinitis Symptom History: She is using occasional nasal saline rinses. She does use a humidifer to help keep her skin moist. She also adds some essential oils to it as well.   Eczema Symptom History: She has a variety of topical steroids that she uses on her skin. She does use wet wraps as well  when it becomes particularly uncontrolled.   Otherwise, there have been no changes to her past medical history, surgical history, family history, or social history.    Review of Systems  Constitutional: Negative.  Negative for chills, fever, malaise/fatigue and weight loss.  HENT: Negative.  Negative for congestion, ear discharge and ear pain.   Eyes: Negative for pain, discharge and redness.  Respiratory: Positive for sputum production and shortness of breath. Negative for cough and wheezing.    Cardiovascular: Negative.  Negative for chest pain and palpitations.  Gastrointestinal: Negative for abdominal pain, constipation, diarrhea, heartburn, nausea and vomiting.  Skin: Positive for itching and rash.  Neurological: Negative for dizziness and headaches.  Endo/Heme/Allergies: Negative for environmental allergies. Does not bruise/bleed easily.       Objective:   Blood pressure 122/74, pulse 76, temperature 98 F (36.7 C), temperature source Temporal, resp. rate 16, height 5' 4.5" (1.638 m), weight 195 lb (88.5 kg), SpO2 96 %. Body mass index is 32.95 kg/m.   Physical Exam:  Physical Exam  Constitutional: She appears well-developed.  Talkative female.   HENT:  Head: Normocephalic and atraumatic.  Right Ear: Tympanic membrane, external ear and ear canal normal.  Left Ear: Tympanic membrane, external ear and ear canal normal.  Nose: Mucosal edema and rhinorrhea present. No nasal deformity or septal deviation. No epistaxis. Right sinus exhibits no maxillary sinus tenderness and no frontal sinus tenderness. Left sinus exhibits no maxillary sinus tenderness and no frontal sinus tenderness.  Mouth/Throat: Uvula is midline and oropharynx is clear and moist. Mucous membranes are not pale and not dry.  Cobblestoning present in the posterior oropharynx.   Eyes: Pupils are equal, round, and reactive to light. Conjunctivae and EOM are normal. Right eye exhibits no chemosis and no discharge. Left eye exhibits no chemosis and no discharge. Right conjunctiva is not injected. Left conjunctiva is not injected.  Cardiovascular: Normal rate, regular rhythm and normal heart sounds.  Respiratory: Effort normal and breath sounds normal. No accessory muscle usage. No tachypnea. No respiratory distress. She has no wheezes. She has no rhonchi. She has no rales. She exhibits no tenderness.  Lymphadenopathy:    She has no cervical adenopathy.  Neurological: She is alert.  Skin: No abrasion, no  petechiae and no rash noted. Rash is not papular, not vesicular and not urticarial. No erythema. No pallor.  Lichenified skin over the bilateral hands with hypopigmentation. There is thickened skin over other areas of her arms as well as her neck and legs. There is no honeycrusting suggestive of Staphylococcal skin infections.  Psychiatric: She has a normal mood and affect.     Diagnostic studies:   Spirometry: results abnormal (FEV1: 1.45/59%, FVC: 1.98/66%, FEV1/FVC: 73%).    Spirometry consistent with possible restrictive disease.     Salvatore Marvel, MD  [facility]

## 2019-03-15 NOTE — Patient Instructions (Addendum)
1. Moderate persistent asthma, uncomplicated - Lung testing looked fairly terrible today.  - Consider starting Dupixent to treat your breathing and your skin.  - It is important to keep follow up appointments so that we can manage your breathing better.  - Daily controller medication(s): Symbicort 160/4.16mcg two puffs twice daily with spacer - Prior to physical activity: ProAir 2 puffs 10-15 minutes before physical activity. - Rescue medications: ProAir 4 puffs every 4-6 hours as needed - Asthma control goals:  * Full participation in all desired activities (may need albuterol before activity) * Albuterol use two time or less a week on average (not counting use with activity) * Cough interfering with sleep two time or less a month * Oral steroids no more than once a year * No hospitalizations  2. Atopic dermatitis - Continue with the topical medications, per your Dermatologist.  3. Allergic rhinitis - Continue with fluticasone two sprays per nostril daily. - Continue with the levocetirizine 5mg  daily.  4. Return in about 3 months (around 06/15/2019). This can be an in-person, a virtual Webex or a telephone follow up visit.   Please inform us of any Emergency Department visits, hospitalizations, or changes in symptoms. Call us before going to the ED for breathing or allergy symptoms since we might be able to fit you in for a sick visit. Feel free to contact us anytime with any questions, problems, or concerns.  It was a pleasure to see you again today!  Websites that have reliable patient information: 1. American Academy of Asthma, Allergy, and Immunology: www.aaaai.org 2. Food Allergy Research and Education (FARE): foodallergy.org 3. Mothers of Asthmatics: http://www.asthmacommunitynetwork.org 4. American College of Allergy, Asthma, and Immunology: www.acaai.org  "Like" [place] on Facebook and Instagram for our latest updates!      Make sure you are registered to vote! If you have  moved or changed any of your contact information, you will need to get this updated before voting!  In some cases, you MAY be able to register to vote online: [email or web address]    Voter ID laws are NOT going into effect for the General Election in November 2020! DO NOT let this stop you from exercising your right to vote!   Absentee voting is the SAFEST way to vote during the coronavirus pandemic!   Download and print an absentee ballot request form at [email or web address] or you can scan the QR code below with your smart phone:      More information on absentee ballots can be found here: [email or web address]

## 2019-05-16 ENCOUNTER — Other Ambulatory Visit: Payer: Self-pay

## 2019-05-16 DIAGNOSIS — Z20822 Contact with and (suspected) exposure to covid-19: Secondary | ICD-10-CM

## 2019-05-18 LAB — NOVEL CORONAVIRUS, NAA: SARS-CoV-2, NAA: NOT DETECTED

## 2019-05-22 ENCOUNTER — Other Ambulatory Visit: Payer: Self-pay

## 2019-05-22 DIAGNOSIS — Z20822 Contact with and (suspected) exposure to covid-19: Secondary | ICD-10-CM

## 2019-05-24 LAB — NOVEL CORONAVIRUS, NAA: SARS-CoV-2, NAA: NOT DETECTED

## 2020-01-25 ENCOUNTER — Other Ambulatory Visit: Payer: Self-pay | Admitting: Allergy & Immunology

## 2020-01-26 ENCOUNTER — Other Ambulatory Visit: Payer: Self-pay

## 2020-01-26 ENCOUNTER — Encounter (HOSPITAL_COMMUNITY): Payer: Self-pay

## 2020-01-26 ENCOUNTER — Emergency Department (HOSPITAL_COMMUNITY)
Admission: EM | Admit: 2020-01-26 | Discharge: 2020-01-26 | Disposition: A | Discharge status: Home / Self Care | Payer: Medicaid Other | Attending: Emergency Medicine | Admitting: Emergency Medicine

## 2020-01-26 ENCOUNTER — Emergency Department (HOSPITAL_COMMUNITY): Payer: Medicaid Other

## 2020-01-26 DIAGNOSIS — J45909 Unspecified asthma, uncomplicated: Secondary | ICD-10-CM | POA: Insufficient documentation

## 2020-01-26 DIAGNOSIS — Y929 Unspecified place or not applicable: Secondary | ICD-10-CM | POA: Insufficient documentation

## 2020-01-26 DIAGNOSIS — Y93E2 Activity, laundry: Secondary | ICD-10-CM | POA: Diagnosis not present

## 2020-01-26 DIAGNOSIS — Y999 Unspecified external cause status: Secondary | ICD-10-CM | POA: Insufficient documentation

## 2020-01-26 DIAGNOSIS — Z79899 Other long term (current) drug therapy: Secondary | ICD-10-CM | POA: Insufficient documentation

## 2020-01-26 DIAGNOSIS — S93402A Sprain of unspecified ligament of left ankle, initial encounter: Secondary | ICD-10-CM | POA: Diagnosis not present

## 2020-01-26 DIAGNOSIS — W010XXA Fall on same level from slipping, tripping and stumbling without subsequent striking against object, initial encounter: Secondary | ICD-10-CM | POA: Insufficient documentation

## 2020-01-26 DIAGNOSIS — S99912A Unspecified injury of left ankle, initial encounter: Secondary | ICD-10-CM | POA: Diagnosis present

## 2020-01-26 NOTE — ED Triage Notes (Signed)
Patient arrives via POV with c/o of left ankle pain x1 week after twisting when she fell in her laundry. Per the patient, she been taking muscle relaxants with no relief.

## 2020-01-26 NOTE — ED Provider Notes (Signed)
[facility] EMERGENCY DEPARTMENT Provider Note   CSN: [id number] Arrival date & time: 01/26/20  1152     History Chief Complaint  Patient presents with  . Left Ankle Pain    Tiffany Ramos is a 49 y.o. female.  HPI  Patient is a 49 year old female with a history of asthma, eczema, sickle cell trait.  Patient is presented today with complaints of left ankle pain for approximately 10 days.  She states that she fell while doing her laundry 10 days ago states that the foot went backwards into the left.  She states that she took some muscle relaxers without any help and she also took some Tylenol.  She states she has not been taking his medications frequently.  She states that her pain is 2/10, achy, worse with touch and movement.  She states that she has noticed some associated swelling this morning.  She denies any fevers, chills, nausea, vomiting, denies any head injury loss of consciousness or neck pain.  Denies any pain in any other joints.  She denies any difficulty walking.     Past Medical History:  Diagnosis Date  . Asthma   . Eczema   . Sickle cell trait (HCC)     There are no problems to display for this patient.   History reviewed. No pertinent surgical history.   OB History   No obstetric history on file.     Family History  Problem Relation Age of Onset  . Hypertension Mother   . Diabetes Mother   . Eczema Father   . Eczema Sister   . Eczema Paternal Aunt     Social History   Tobacco Use  . Smoking status: Never Smoker  . Smokeless tobacco: Never Used  Vaping Use  . Vaping Use: Never used  Substance Use Topics  . Alcohol use: Not on file  . Drug use: Not on file    Home Medications Prior to Admission medications   Medication Sig Start Date End Date Taking? Authorizing Provider  albuterol (PROAIR HFA) 108 (90 Base) MCG/ACT inhaler Inhale 2 puffs into the lungs every 4 (four) hours as needed. 03/15/19   Alfonse Spruce, MD   budesonide-formoterol [facility]) 160-4.5 MCG/ACT inhaler INHALE 2 PUFFS BY MOUTH TWICE A DAY AS DIRECTED RINSE,GARGLE AND SPIT AFTER USE 03/15/19   Alfonse Spruce, MD  Cholecalciferol (D 1000) 1000 UNITS capsule Take 1,000 Units by mouth daily.    [provider]  cycloSPORINE (RESTASIS) 0.05 % ophthalmic emulsion 1 drop 2 (two) times daily.    [provider]  desonide (DESOWEN) 0.05 % ointment daily. 05/23/14   [provider]  fluticasone Aled[facility] Grana) 50 MCG/ACT nasal spray Use two sprays in each nostril once daily as directed 12/26/17   Alfonse Spruce, MD  hydrocortisone 2.5 % ointment Apply to affected areas daily 09/02/16   [provider]  Olopatadine HCl (PATADAY) 0.2 % SOLN Apply to eye 2 (two) times daily.    [provider]  prednisoLONE acetate (PRED FORTE) 1 % ophthalmic suspension Place 1 drop into both eyes at bedtime.    [provider]  RA ALLERGY RELIEF 10 MG tablet take 1 tablet by mouth once daily 07/16/16   Kozlow, Alvira Philips, MD  triamcinolone ointment (KENALOG) 0.1 % 2 (two) times daily. 10/10/14   [provider]    Allergies    Other  Review of Systems   Review of Systems  Constitutional: Negative for fever.  HENT: Negative for congestion.   Respiratory: Negative for shortness of breath.   Cardiovascular: Negative for chest pain.  Gastrointestinal: Negative for abdominal distention.  Musculoskeletal:       Left ankle pain  Neurological: Negative for dizziness and headaches.    Physical Exam Updated Vital Signs BP (!) 153/91 (BP Location: Right Arm)   Pulse 99   Temp 98.4 F (36.9 C) (Oral)   Resp 18   Ht 5\' 5"  (1.651 m)   Wt 74.8 kg   SpO2 100%   BMI 27.46 kg/m   Physical Exam Vitals and nursing note reviewed.  Constitutional:      General: She is not in acute distress.    Appearance: Normal appearance. She is not ill-appearing.  HENT:     Head: Normocephalic and atraumatic.   Eyes:     General: No scleral icterus.       Right eye: No discharge.        Left eye: No discharge.     Conjunctiva/sclera: Conjunctivae normal.  Cardiovascular:     Rate and Rhythm: Normal rate.  Pulmonary:     Effort: Pulmonary effort is normal.     Breath sounds: No stridor.  Musculoskeletal:     Cervical back: Normal range of motion. No tenderness.     Comments: Tenderness to palpation of the left lateral ankle.  Full range of motion and no tenderness to palpation of knees hips right ankle, upper extremities  Skin:    General: Skin is warm and dry.     Capillary Refill: Capillary refill takes less than 2 seconds.  Neurological:     Mental Status: She is alert and oriented to person, place, and time. Mental status is at baseline.     ED Results / Procedures / Treatments   Labs (all labs ordered are listed, but only abnormal results are displayed) Labs Reviewed - No data to display  EKG None  Radiology DG Ankle Complete Left  Result Date: 01/26/2020 CLINICAL DATA:  Status post fall with left ankle pain. EXAM: LEFT ANKLE COMPLETE - 3+ VIEW COMPARISON:  None. FINDINGS: No acute fracture or dislocation is identified. Generalized swelling of the left ankle is noted. Anti calcaneal spur is noted. IMPRESSION: No acute fracture or dislocation. Electronically Signed   By: 03/28/2020 M.D.   On: 01/26/2020 13:34    Procedures Procedures (including critical care time)  Medications Ordered in ED Medications - No data to display  ED Course  I have reviewed the triage vital signs and the nursing notes.  Pertinent labs & imaging results that were available during my care of the patient were reviewed by me and considered in my medical decision making (see chart for details).    MDM Rules/Calculators/A&P                          Patient presents today with ankle pain for 10 days.  She fell and had injury to the left ankle but states that she did not hit any other joints, bones  states she did not hit her head or lose consciousness.  On physical exam she has tenderness palpation of the left lateral malleolus no bruising or deformity.  No notable swelling.  She is well-appearing and has vital signs within normal limits she although she is mildly hypertensive.  She denies any other symptoms today.    I personally reviewed the patient's left ankle x-ray which is negative for fracture.  Suspect acute ankle sprain.  Will place patient in ankle brace and provided with crutches.   Final Clinical Impression(s) / ED Diagnoses Final diagnoses:  Sprain of left ankle, unspecified ligament, initial encounter    Rx / DC Orders ED Discharge Orders    None       Gailen Shelter, [place] 01/27/20 [id number]    Pricilla Loveless, MD 01/27/20 2214

## 2020-01-26 NOTE — ED Notes (Signed)
Ortho tech called 

## 2020-01-26 NOTE — Progress Notes (Signed)
Orthopedic Tech Progress Note Patient Details:  Tiffany Ramos 03-Jun-1971 [id number]   Ortho Devices Type of Ortho Device: ASO Ortho Device/Splint Location: LLE Ortho Device/Splint Interventions: Ordered, Application, Adjustment       Michelle Piper 01/26/2020, 3:31 PM

## 2020-01-26 NOTE — ED Notes (Signed)
Patient given discharge instructions. Questions were answered. Patient verbalized understanding of discharge instructions and care at home.  

## 2020-01-29 ENCOUNTER — Other Ambulatory Visit: Payer: Self-pay

## 2020-01-29 MED ORDER — FLUTICASONE PROPIONATE 50 MCG/ACT NA SUSP: NASAL | 0 refills | Status: AC

## 2020-03-02 ENCOUNTER — Other Ambulatory Visit: Payer: Self-pay | Admitting: Allergy & Immunology

## 2020-05-11 ENCOUNTER — Other Ambulatory Visit: Payer: Self-pay | Admitting: Allergy & Immunology

## 2020-10-23 ENCOUNTER — Other Ambulatory Visit: Payer: Self-pay | Admitting: Physician Assistant

## 2020-11-03 ENCOUNTER — Other Ambulatory Visit: Payer: Self-pay | Admitting: Internal Medicine

## 2020-11-03 DIAGNOSIS — Z1231 Encounter for screening mammogram for malignant neoplasm of breast: Secondary | ICD-10-CM

## 2022-06-19 IMAGING — CR DG ANKLE COMPLETE 3+V*L*
3 series · 3 of 3 positions shown · non-contrast
Comparison: None.

CLINICAL DATA: Status post fall with left ankle pain.

EXAM:
LEFT ANKLE COMPLETE - 3+ VIEW

[ankle ap]
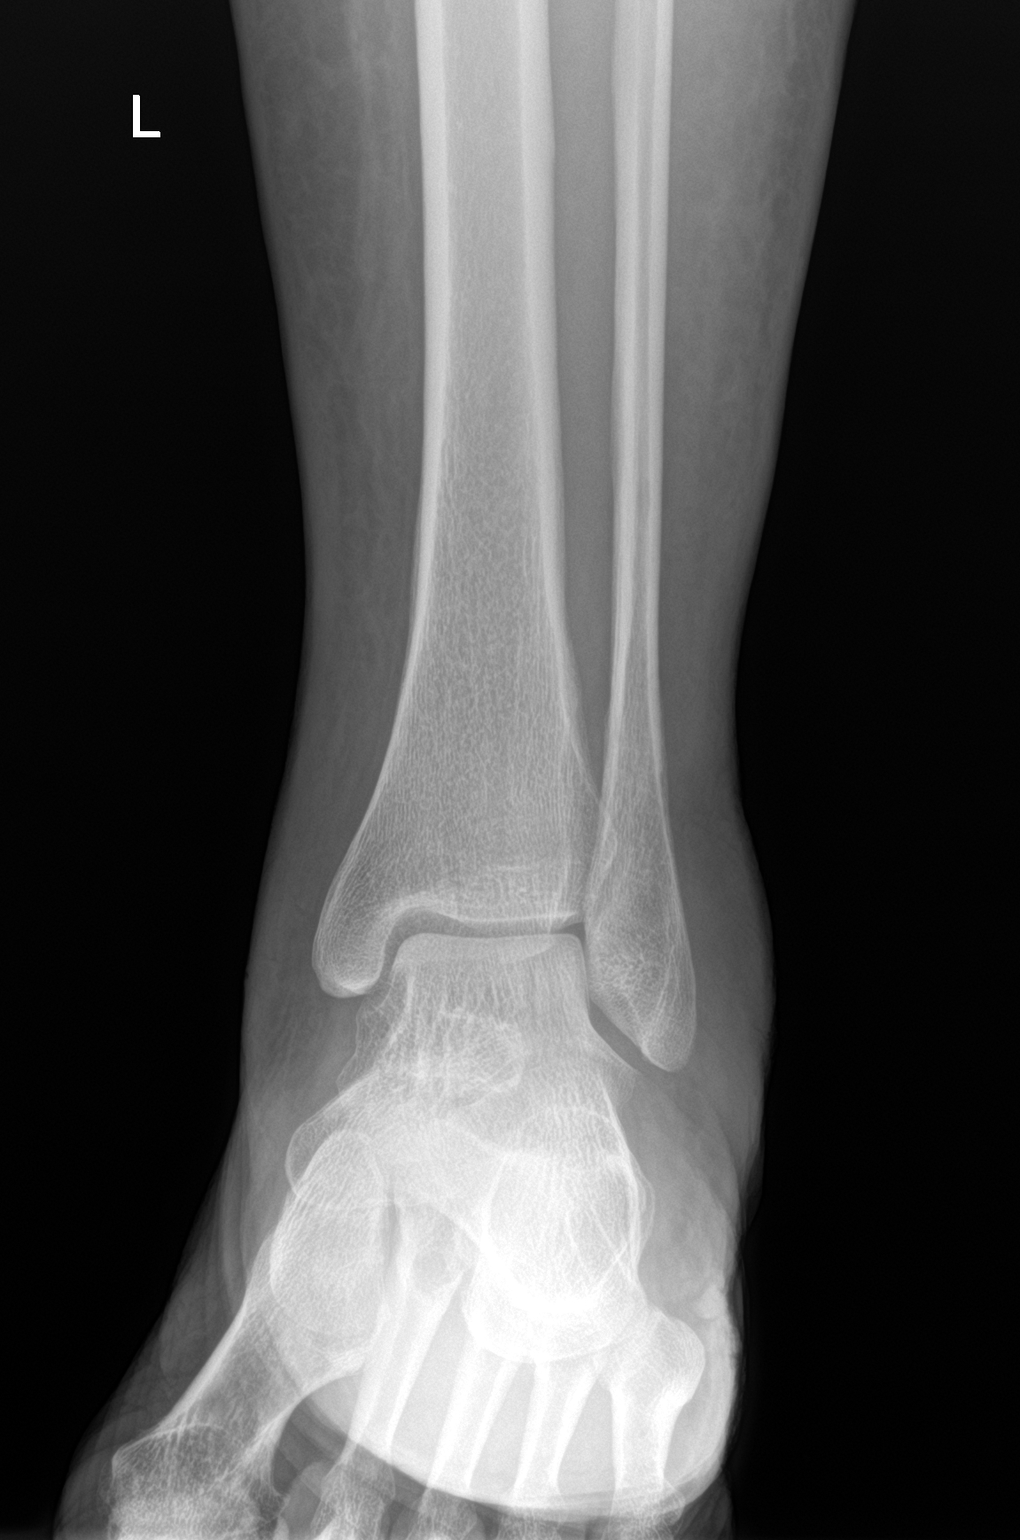

[ankle obl]
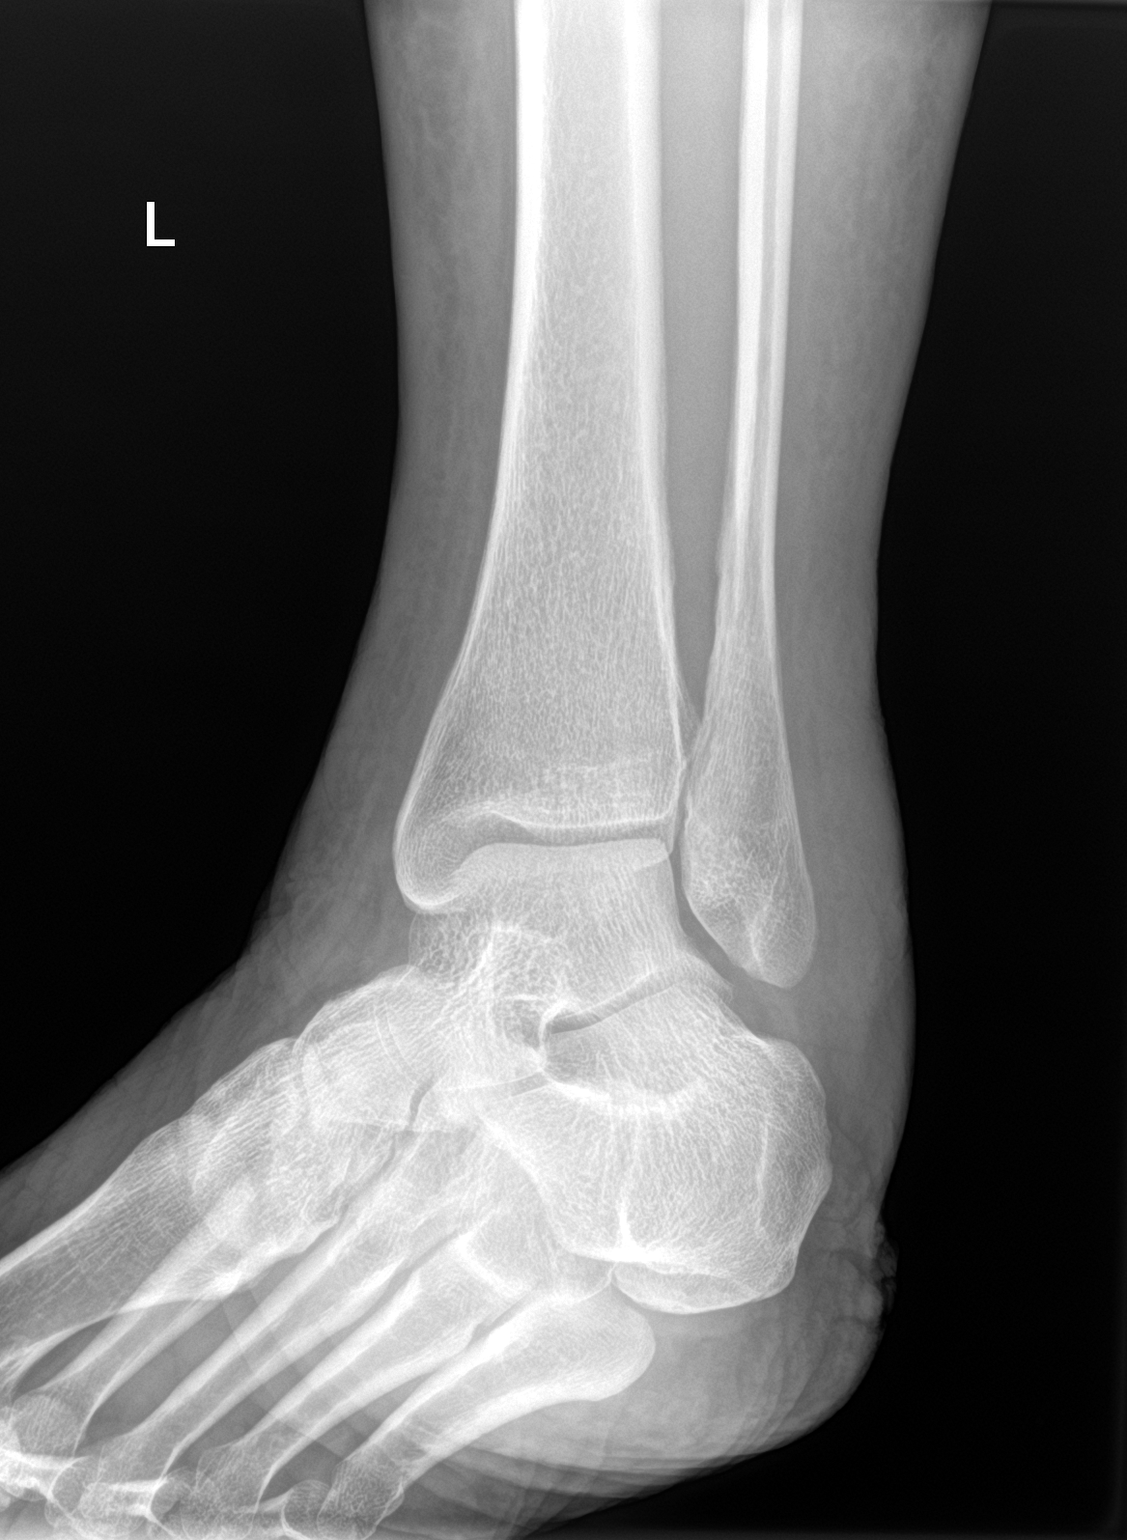

[ankle lat]
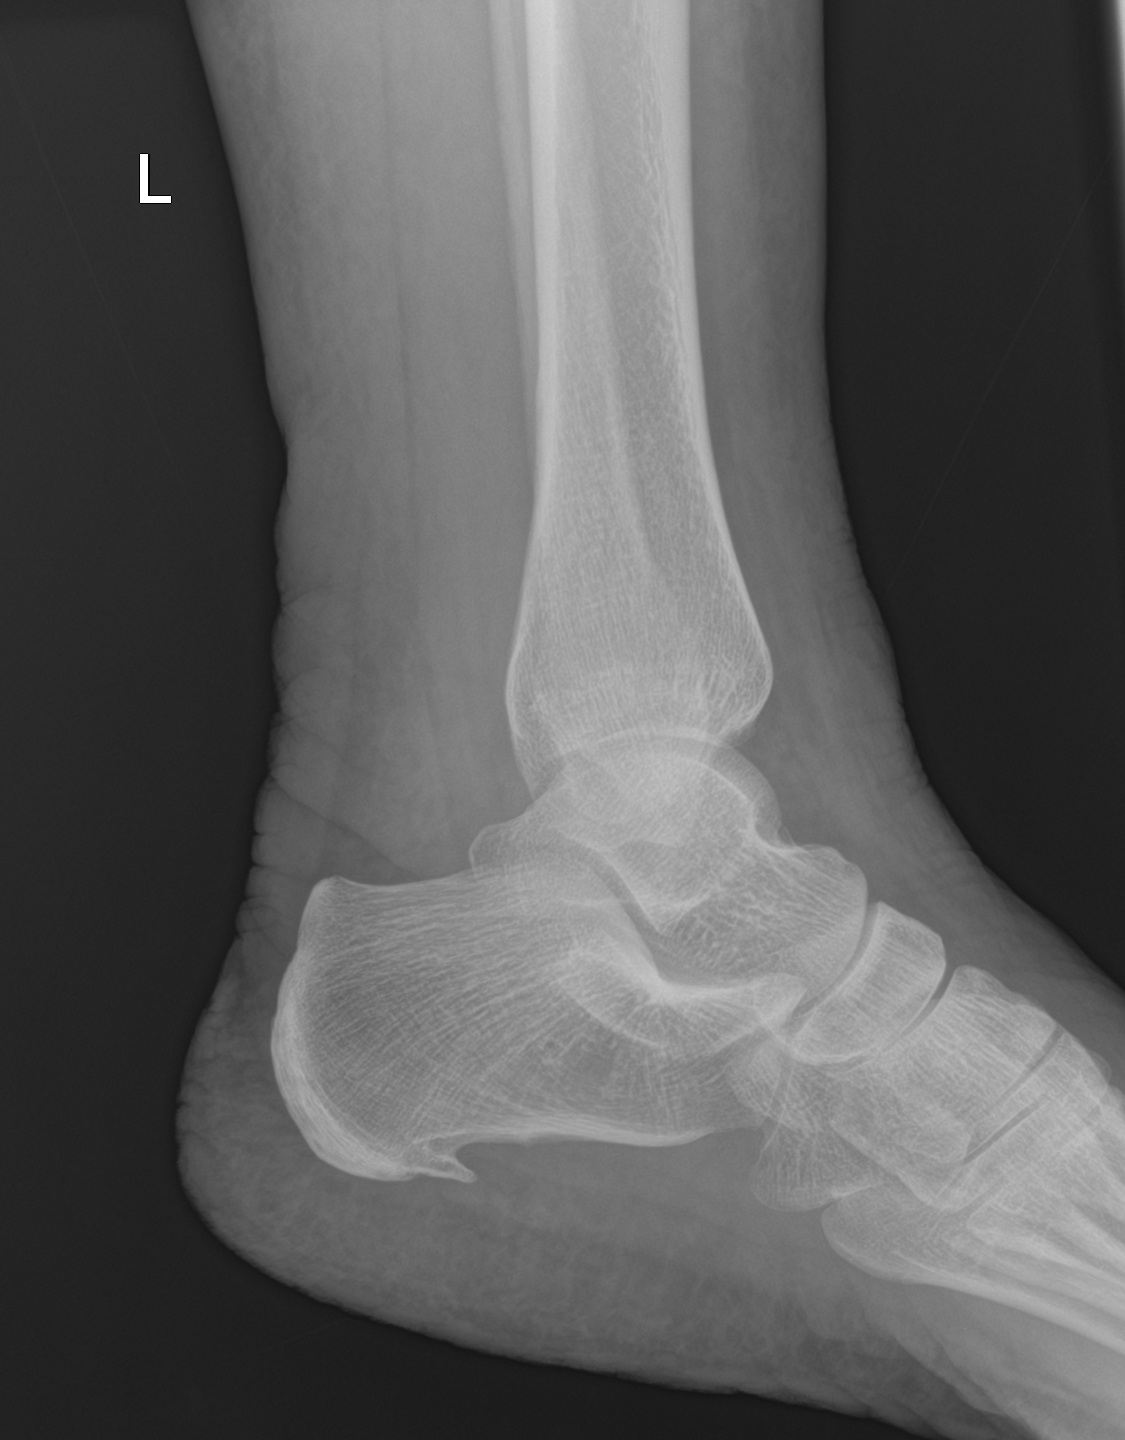

[3 of 3 positions shown; findings below may reference images not displayed]

FINDINGS: No acute fracture or dislocation is identified. Generalized swelling
of the left ankle is noted. Anti calcaneal spur is noted.
IMPRESSION: No acute fracture or dislocation.

## 2024-03-01 ENCOUNTER — Other Ambulatory Visit: Payer: Self-pay | Admitting: Physician Assistant

## 2024-03-01 DIAGNOSIS — Z1231 Encounter for screening mammogram for malignant neoplasm of breast: Secondary | ICD-10-CM

## 2024-03-14 ENCOUNTER — Encounter: Payer: Self-pay | Admitting: Gastroenterology

## 2024-04-03 ENCOUNTER — Ambulatory Visit
Admission: RE | Admit: 2024-04-03 | Discharge: 2024-04-03 | Disposition: A | Discharge status: Home / Self Care | Source: Ambulatory Visit | Attending: Physician Assistant | Admitting: Physician Assistant

## 2024-04-03 DIAGNOSIS — Z1231 Encounter for screening mammogram for malignant neoplasm of breast: Secondary | ICD-10-CM

## 2024-04-12 DIAGNOSIS — Z889 Allergy status to unspecified drugs, medicaments and biological substances status: Secondary | ICD-10-CM | POA: Insufficient documentation

## 2024-04-12 DIAGNOSIS — R7303 Prediabetes: Secondary | ICD-10-CM | POA: Insufficient documentation

## 2024-04-12 DIAGNOSIS — E559 Vitamin D deficiency, unspecified: Secondary | ICD-10-CM | POA: Insufficient documentation

## 2024-04-12 DIAGNOSIS — R739 Hyperglycemia, unspecified: Secondary | ICD-10-CM | POA: Insufficient documentation

## 2024-04-12 DIAGNOSIS — E7800 Pure hypercholesterolemia, unspecified: Secondary | ICD-10-CM | POA: Insufficient documentation

## 2024-04-12 DIAGNOSIS — J45909 Unspecified asthma, uncomplicated: Secondary | ICD-10-CM | POA: Insufficient documentation

## 2024-04-12 DIAGNOSIS — Z6832 Body mass index (BMI) 32.0-32.9, adult: Secondary | ICD-10-CM | POA: Insufficient documentation

## 2024-04-12 DIAGNOSIS — R7301 Impaired fasting glucose: Secondary | ICD-10-CM | POA: Insufficient documentation

## 2024-04-12 DIAGNOSIS — J302 Other seasonal allergic rhinitis: Secondary | ICD-10-CM | POA: Insufficient documentation

## 2024-04-12 DIAGNOSIS — L309 Dermatitis, unspecified: Secondary | ICD-10-CM | POA: Insufficient documentation

## 2024-04-12 DIAGNOSIS — E663 Overweight: Secondary | ICD-10-CM | POA: Insufficient documentation

## 2024-04-17 ENCOUNTER — Telehealth: Payer: Self-pay | Admitting: Gastroenterology

## 2024-04-17 ENCOUNTER — Ambulatory Visit (AMBULATORY_SURGERY_CENTER): Admitting: *Deleted

## 2024-04-17 VITALS — Ht 65.0 in | Wt 199.0 lb

## 2024-04-17 DIAGNOSIS — Z1211 Encounter for screening for malignant neoplasm of colon: Secondary | ICD-10-CM

## 2024-04-17 MED ORDER — PEG 3350-KCL-NA BICARB-NACL 420 G PO SOLR: 4000.0000 mL | Freq: Once | ORAL | 0 refills | Status: AC

## 2024-04-17 MED ORDER — NA SULFATE-K SULFATE-MG SULF 17.5-3.13-1.6 GM/177ML PO SOLN: 1.0000 | Freq: Once | ORAL | 0 refills | Status: AC

## 2024-04-17 NOTE — Telephone Encounter (Signed)
 Inbound call from patient stating that her insurance does not cover her prep medication and was wondering if she can have the generic version. Patient is requesting a call back. Please advise.

## 2024-04-17 NOTE — Addendum Note (Signed)
 Addended by: GERMAN FRANNE BROCKS on: 04/17/2024 02:53 PM   Modules accepted: Orders

## 2024-04-17 NOTE — Progress Notes (Signed)
 Pt's name and DOB verified at the beginning of the pre-visit with 2 identifiers  Pt denies any difficulty with ambulating,sitting, laying down or rolling side to side  Pt has no issues moving head neck or swallowing  No egg or soy allergy known to patient   No issues known to pt with past sedation  No FH of Malignant Hyperthermia  Pt is not on home 02   Pt is not on blood thinners   Pt denies issues with constipation    Pt is not on dialysis  Pt denise any abnormal heart rhythms   Pt denies any upcoming cardiac testing  Patient's chart reviewed by Norleen Schillings CNRA prior to pre-visit and patient appropriate for the [facility].  Pre-visit completed and red dot placed by patient's name on their procedure day (on provider's schedule).    Visit in person  Pt states weight is    Pt given  both [facility] main # and MD on call # prior to instructions.  Informed pt to come in at the time discussed and is shown on PV instructions.  Pt instructed to use Single[employer].com or GoodRx for a price reduction on prep  Instructed pt where to find PV instructions in My Chart  Instructed pt on all aspects of written instructions including med holds clothing to wear and foods to eat and not eat as well as after procedure legal restrictions and to call MD on call if needed.. Pt states understanding. Instructed pt to review instructions again prior to procedure and call main # given if has any questions or any issues. Pt states they will.

## 2024-04-17 NOTE — Telephone Encounter (Signed)
 Unable to reach patient regarding new prep and new instructions.  Left detailed message along with instructions to pick up the new prep kit called in to pharmacy.  New prep instructions sent in My Chart and through the mail.

## 2024-04-25 ENCOUNTER — Encounter: Payer: Self-pay | Admitting: Gastroenterology

## 2024-04-25 ENCOUNTER — Ambulatory Visit: Admitting: Gastroenterology

## 2024-04-25 VITALS — BP 143/44 | HR 61 | Temp 97.5°F | Resp 12 | Ht 65.0 in | Wt 199.0 lb

## 2024-04-25 DIAGNOSIS — Z1211 Encounter for screening for malignant neoplasm of colon: Secondary | ICD-10-CM

## 2024-04-25 MED ORDER — SODIUM CHLORIDE 0.9 % IV SOLN: 500.0000 mL | Freq: Once | INTRAVENOUS | Status: DC

## 2024-04-25 NOTE — Progress Notes (Signed)
 Pt's states no medical or surgical changes since previsit or office visit.

## 2024-04-25 NOTE — Patient Instructions (Signed)
 Resume previous diet. Continue present medications. Repeat colonoscopy in 2-3 years because the prep was suboptimal. Recommend 2 day bowel prep with next colonoscopy.  YOU HAD AN ENDOSCOPIC PROCEDURE TODAY AT THE [facility]:   Refer to the procedure report that was given to you for any specific questions about what was found during the examination.  If the procedure report does not answer your questions, please call your gastroenterologist to clarify.  If you requested that your care partner not be given the details of your procedure findings, then the procedure report has been included in a sealed envelope for you to review at your convenience later.  YOU SHOULD EXPECT: Some feelings of bloating in the abdomen. Passage of more gas than usual.  Walking can help get rid of the air that was put into your GI tract during the procedure and reduce the bloating. If you had a lower endoscopy (such as a colonoscopy or flexible sigmoidoscopy) you may notice spotting of blood in your stool or on the toilet paper. If you underwent a bowel prep for your procedure, you may not have a normal bowel movement for a few days.  Please Note:  You might notice some irritation and congestion in your nose or some drainage.  This is from the oxygen used during your procedure.  There is no need for concern and it should clear up in a day or so.  SYMPTOMS TO REPORT IMMEDIATELY:  Following lower endoscopy (colonoscopy or flexible sigmoidoscopy):  Excessive amounts of blood in the stool  Significant tenderness or worsening of abdominal pains  Swelling of the abdomen that is new, acute  Fever of 100F or higher   For urgent or emergent issues, a gastroenterologist can be reached at any hour by calling [phone]. Do not use MyChart messaging for urgent concerns.    DIET:  We do recommend a small meal at first, but then you may proceed to your regular diet.  Drink plenty of fluids but you should avoid  alcoholic beverages for 24 hours.  ACTIVITY:  You should plan to take it easy for the rest of today and you should NOT DRIVE or use heavy machinery until tomorrow (because of the sedation medicines used during the test).    FOLLOW UP: Our staff will call the number listed on your records the next business day following your procedure.  We will call around 7:15- 8:00 am to check on you and address any questions or concerns that you may have regarding the information given to you following your procedure. If we do not reach you, we will leave a message.     If any biopsies were taken you will be contacted by phone or by letter within the next 1-3 weeks.  Please call us  at [phone] if you have not heard about the biopsies in 3 weeks.    SIGNATURES/CONFIDENTIALITY: You and/or your care partner have signed paperwork which will be entered into your electronic medical record.  These signatures attest to the fact that that the information above on your After Visit Summary has been reviewed and is understood.  Full responsibility of the confidentiality of this discharge information lies with you and/or your care-partner.

## 2024-04-25 NOTE — Progress Notes (Signed)
 Vss nad trans to pacu

## 2024-04-25 NOTE — Progress Notes (Signed)
 [facility] History and Physical   Primary Care Physician:  Tiffany Rosina SAILOR, PA   Reason for Procedure:   Colon cancer screening  Plan:    Screening colonoscopy     HPI: Tiffany Ramos is a 53 y.o. female undergoing initial average risk screening colonoscopy.  She has no family history of colon cancer and no chronic GI symptoms.    Past Medical History:  Diagnosis Date   Asthma    Cataract    Eczema    Sickle cell trait     Past Surgical History:  Procedure Laterality Date   CATARACT EXTRACTION Bilateral     Prior to Admission medications   Medication Sig Start Date End Date Taking? Authorizing Provider  albuterol  (VENTOLIN  HFA) 108 (90 Base) MCG/ACT inhaler INHALE 2 PUFFS INTO THE LUNGS EVERY 4 HOURS AS NEEDED 05/12/20   Tiffany Marty Saltness, MD  B Complex-C-Biotin-D-Zinc-FA (VITAL-D RX) 1 MG TABS 1 tab(s) orally once a day    [provider]  budesonide -formoterol  (SYMBICORT ) 160-4.5 MCG/ACT inhaler INHALE 2 PUFFS BY MOUTH TWICE A DAY AS DIRECTED RINSE,GARGLE AND SPIT AFTER USE 03/15/19   Tiffany Marty Saltness, MD  Cholecalciferol (D 1000) 1000 UNITS capsule Take 1,000 Units by mouth daily.    [provider]  cycloSPORINE (RESTASIS) 0.05 % ophthalmic emulsion 1 drop 2 (two) times daily.    [provider]  desonide (DESOWEN) 0.05 % ointment daily. 05/23/14   [provider]  Dupilumab (DUPIXENT) 200 MG/1. SOAJ 1.14 mL subcutaneously every 2 weeks    [provider]  fluticasone  (FLONASE ) 50 MCG/ACT nasal spray Use two sprays in each nostril once daily as directed 01/29/20   Tiffany Marty Saltness, MD  hydrocortisone 2.5 % ointment Apply to affected areas daily 09/02/16   [provider]  Olopatadine HCl (PATADAY) 0.2 % SOLN Apply to eye 2 (two) times daily. Patient not taking: Reported on 04/25/2024    [provider]  prednisoLONE acetate (PRED FORTE) 1 % ophthalmic suspension Place 1 drop  into both eyes at bedtime. Patient not taking: Reported on 04/25/2024    [provider]  RA ALLERGY RELIEF 10 MG tablet take 1 tablet by mouth once daily Patient not taking: Reported on 04/17/2024 07/16/16   Tiffany Ramos, Tiffany J, MD  triamcinolone ointment (KENALOG) 0.1 % 2 (two) times daily. 10/10/14   [provider]    Current Outpatient Medications  Medication Sig Dispense Refill   albuterol  (VENTOLIN  HFA) 108 (90 Base) MCG/ACT inhaler INHALE 2 PUFFS INTO THE LUNGS EVERY 4 HOURS AS NEEDED 18 g 0   B Complex-C-Biotin-D-Zinc-FA (VITAL-D RX) 1 MG TABS 1 tab(s) orally once a day     budesonide -formoterol  (SYMBICORT ) 160-4.5 MCG/ACT inhaler INHALE 2 PUFFS BY MOUTH TWICE A DAY AS DIRECTED RINSE,GARGLE AND SPIT AFTER USE 10.2 g 2   Cholecalciferol (D 1000) 1000 UNITS capsule Take 1,000 Units by mouth daily.     cycloSPORINE (RESTASIS) 0.05 % ophthalmic emulsion 1 drop 2 (two) times daily.     desonide (DESOWEN) 0.05 % ointment daily.     Dupilumab (DUPIXENT) 200 MG/1. SOAJ 1.14 mL subcutaneously every 2 weeks     fluticasone  (FLONASE ) 50 MCG/ACT nasal spray Use two sprays in each nostril once daily as directed 16 g 0   hydrocortisone 2.5 % ointment Apply to affected areas daily     Olopatadine HCl (PATADAY) 0.2 % SOLN Apply to eye 2 (two) times daily. (Patient not taking: Reported on 04/25/2024)  prednisoLONE acetate (PRED FORTE) 1 % ophthalmic suspension Place 1 drop into both eyes at bedtime. (Patient not taking: Reported on 04/25/2024)     RA ALLERGY RELIEF 10 MG tablet take 1 tablet by mouth once daily (Patient not taking: Reported on 04/17/2024) 30 tablet 5   triamcinolone ointment (KENALOG) 0.1 % 2 (two) times daily.     Current Facility-Administered Medications  Medication Dose Route Frequency Provider Last Rate Last Admin   0.9 %  sodium chloride infusion  500 mL Intravenous Once Tiffany Glendia BRAVO, MD        Allergies as of 04/25/2024 - Review Complete 04/25/2024   Allergen Reaction Noted   Other Other (See Comments) 12/26/2017    Family History  Problem Relation Age of Onset   Hypertension Mother    Diabetes Mother    Eczema Father    Eczema Sister    Eczema Paternal Aunt    Breast cancer Neg Hx    Colon polyps Neg Hx    Colon cancer Neg Hx    Esophageal cancer Neg Hx    Rectal cancer Neg Hx    Stomach cancer Neg Hx     Social History   Socioeconomic History   Marital status: Legally Separated    Spouse name: Not on file   Number of children: Not on file   Years of education: Not on file   Highest education level: Not on file  Occupational History   Not on file  Tobacco Use   Smoking status: Never   Smokeless tobacco: Never  Vaping Use   Vaping status: Never Used  Substance and Sexual Activity   Alcohol use: Never   Drug use: Never   Sexual activity: Not on file  Other Topics Concern   Not on file  Social History Narrative   Not on file   Social Drivers of Health   Financial Resource Strain: Not on file  Food Insecurity: Not on file  Transportation Needs: Not on file  Physical Activity: Not on file  Stress: Not on file  Social Connections: Not on file  Intimate Partner Violence: Not on file    Review of Systems:  All other review of systems negative except as mentioned in the HPI.  Physical Exam: Vital signs BP (!) 214/101 (BP Location: Right Arm) Comment: right lower arm - CRNA Mark notified  Pulse 71   Temp (!) 97.5 F (36.4 C) (Temporal)   Ht 5' 5 (1.651 m)   Wt 199 lb (90.3 kg)   SpO2 100%   BMI 33.12 kg/m   General:   Alert,  Well-developed, well-nourished, pleasant and cooperative in NAD Airway:  Mallampati 2 Lungs:  Clear throughout to auscultation.   Heart:  Regular rate and rhythm; no murmurs, clicks, rubs,  or gallops. Abdomen:  Soft, nontender and nondistended. Normal bowel sounds.   Neuro/Psych:  Normal mood and affect. A and O x 3   Tiffany Cuccia E. Stacia, MD [facility]

## 2024-04-25 NOTE — Op Note (Signed)
 [facility] Patient Name: Tiffany Ramos Procedure Date: 04/25/2024 10:19 AM MRN: [id number] Endoscopist: Glendia E. Stacia , MD, [id number] Age: 53 Referring MD:  Date of Birth: 12/26/1970 Gender: Female Account #: [id number] Procedure:                Colonoscopy Indications:              Screening for colorectal malignant neoplasm, This                            is the patient's first colonoscopy Medicines:                Monitored Anesthesia Care Procedure:                Pre-Anesthesia Assessment:                           - Prior to the procedure, a History and Physical                            was performed, and patient medications and                            allergies were reviewed. The patient's tolerance of                            previous anesthesia was also reviewed. The risks                            and benefits of the procedure and the sedation                            options and risks were discussed with the patient.                            All questions were answered, and informed consent                            was obtained. Prior Anticoagulants: The patient has                            taken no anticoagulant or antiplatelet agents. ASA                            Grade Assessment: II - A patient with mild systemic                            disease. After reviewing the risks and benefits,                            the patient was deemed in satisfactory condition to                            undergo the procedure.  After obtaining informed consent, the colonoscope                            was passed under direct vision. Throughout the                            procedure, the patient's blood pressure, pulse, and                            oxygen saturations were monitored continuously. The                            CF [id number] #[id number] was introduced through the anus                            and advanced  to the the cecum, identified by                            appendiceal orifice and ileocecal valve. The                            colonoscopy was performed without difficulty. The                            patient tolerated the procedure well. The quality                            of the bowel preparation was fair. The ileocecal                            valve, appendiceal orifice, and rectum were                            photographed. The bowel preparation used was SUPREP                            via split dose instruction. Scope In: 10:32:44 AM Scope Out: 10:50:56 AM Scope Withdrawal Time: 0 hours 14 minutes 3 seconds  Total Procedure Duration: 0 hours 18 minutes 12 seconds  Findings:                 The perianal and digital rectal examinations were                            normal. Pertinent negatives include normal                            sphincter tone and no palpable rectal lesions.                           The colon (entire examined portion) appeared normal.                           A large amount of semi-liquid stool was found in  the entire colon, making visualization difficult.                            Lavage of the area was performed using greater than                            500 mL of tap water, resulting in clearance with                            fair visualization.                           The retroflexed view of the distal rectum and anal                            verge was normal and showed no anal or rectal                            abnormalities. Complications:            No immediate complications. Estimated Blood Loss:     Estimated blood loss: none. Impression:               - Preparation of the colon was fair.                           - The entire examined colon is normal.                           - Stool in the entire examined colon.                           - The distal rectum and anal verge are normal on                             retroflexion view.                           - No specimens collected. Recommendation:           - Patient has a contact number available for                            emergencies. The signs and symptoms of potential                            delayed complications were discussed with the                            patient. Return to normal activities tomorrow.                            Written discharge instructions were provided to the                            patient.                           -  Resume previous diet.                           - Continue present medications.                           - Repeat colonoscopy in 3 - 5 years because the                            bowel preparation was suboptimal.                           - Recommend 2 day bowel prep with next colonoscopy. Terrah Decoster E. Stacia, MD 04/25/2024 10:57:09 AM This report has been signed electronically.

## 2024-04-26 ENCOUNTER — Telehealth: Payer: Self-pay | Admitting: *Deleted

## 2024-04-26 NOTE — Telephone Encounter (Signed)
  Follow up Call-     04/25/2024    9:46 AM  Call back number  Post procedure Call Back phone  # [phone]  Permission to leave phone message Yes     Patient questions:  Do you have a fever, pain , or abdominal swelling? No. Pain Score  0 *  Have you tolerated food without any problems? Yes.    Have you been able to return to your normal activities? Yes.    Do you have any questions about your discharge instructions: Diet   No. Medications  No. Follow up visit  No.  Do you have questions or concerns about your Care? No.  Actions: * If pain score is 4 or above: No action needed, pain <4.
